# Patient Record
Sex: Female | Born: 1980 | Race: White | Hispanic: No | Marital: Single | Smoking: Former smoker
Health system: Southern US, Community
[De-identification: ages and names within clinical notes are randomized; demographics above are authoritative.]

## PROBLEM LIST (undated history)

## (undated) DIAGNOSIS — Z789 Other specified health status: Secondary | ICD-10-CM

---

## 2007-05-08 ENCOUNTER — Emergency Department: Payer: Self-pay | Admitting: Emergency Medicine

## 2007-05-09 ENCOUNTER — Other Ambulatory Visit: Payer: Self-pay

## 2008-01-03 ENCOUNTER — Emergency Department: Payer: Self-pay | Admitting: Emergency Medicine

## 2010-01-07 IMAGING — CR DG HEEL 2V*R*
1 series · 2 of 2 positions shown · non-contrast
Comparison: none

REASON FOR EXAM: Junped off a truck and now has (R) heel pain
COMMENTS:

PROCEDURE:     DXR - DXR CALCANEOUS - HEEL RIGHT  - January 04, 2008 [DATE]
RESULT:     Images of the RIGHT calcaneus demonstrate a fracture through the
posterior aspect without significant distraction. CT correlation may be
beneficial for treatment planning and further extent of the fracture.

[Series 1: view not recorded · 0.17mm/px · 2 of 2 slices shown]
[im 1/2]
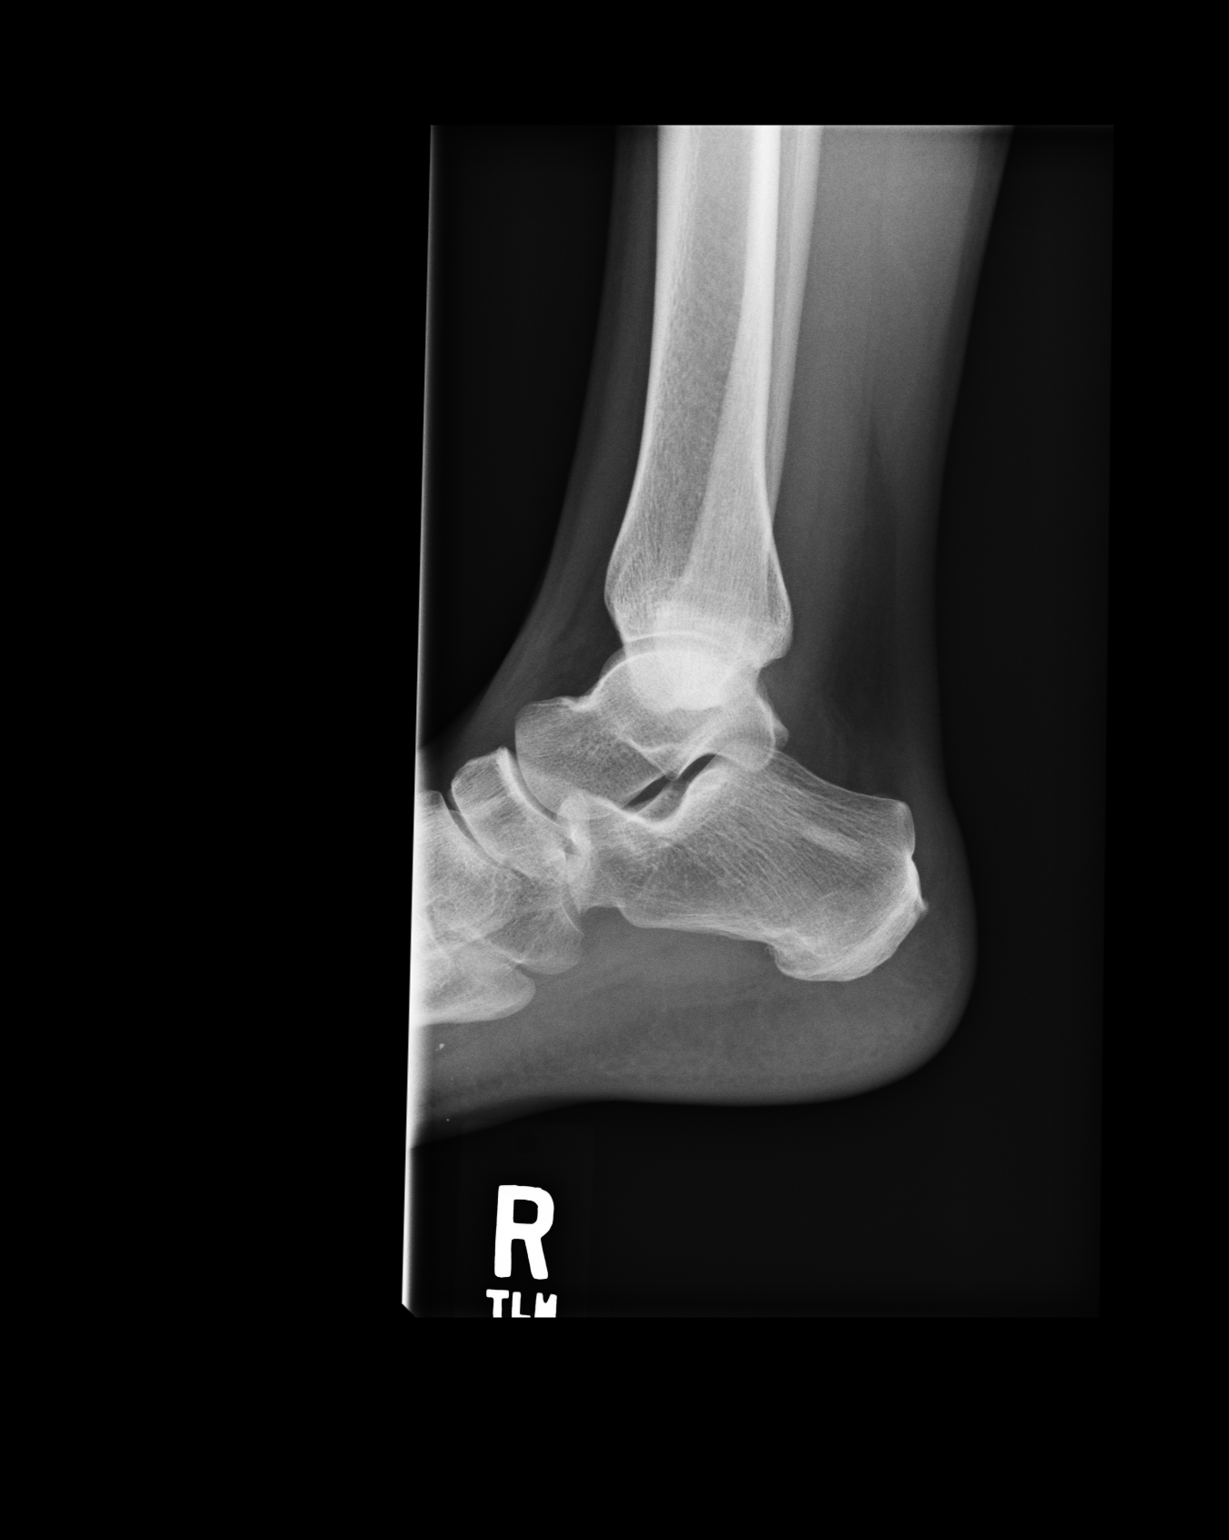
[im 2/2]
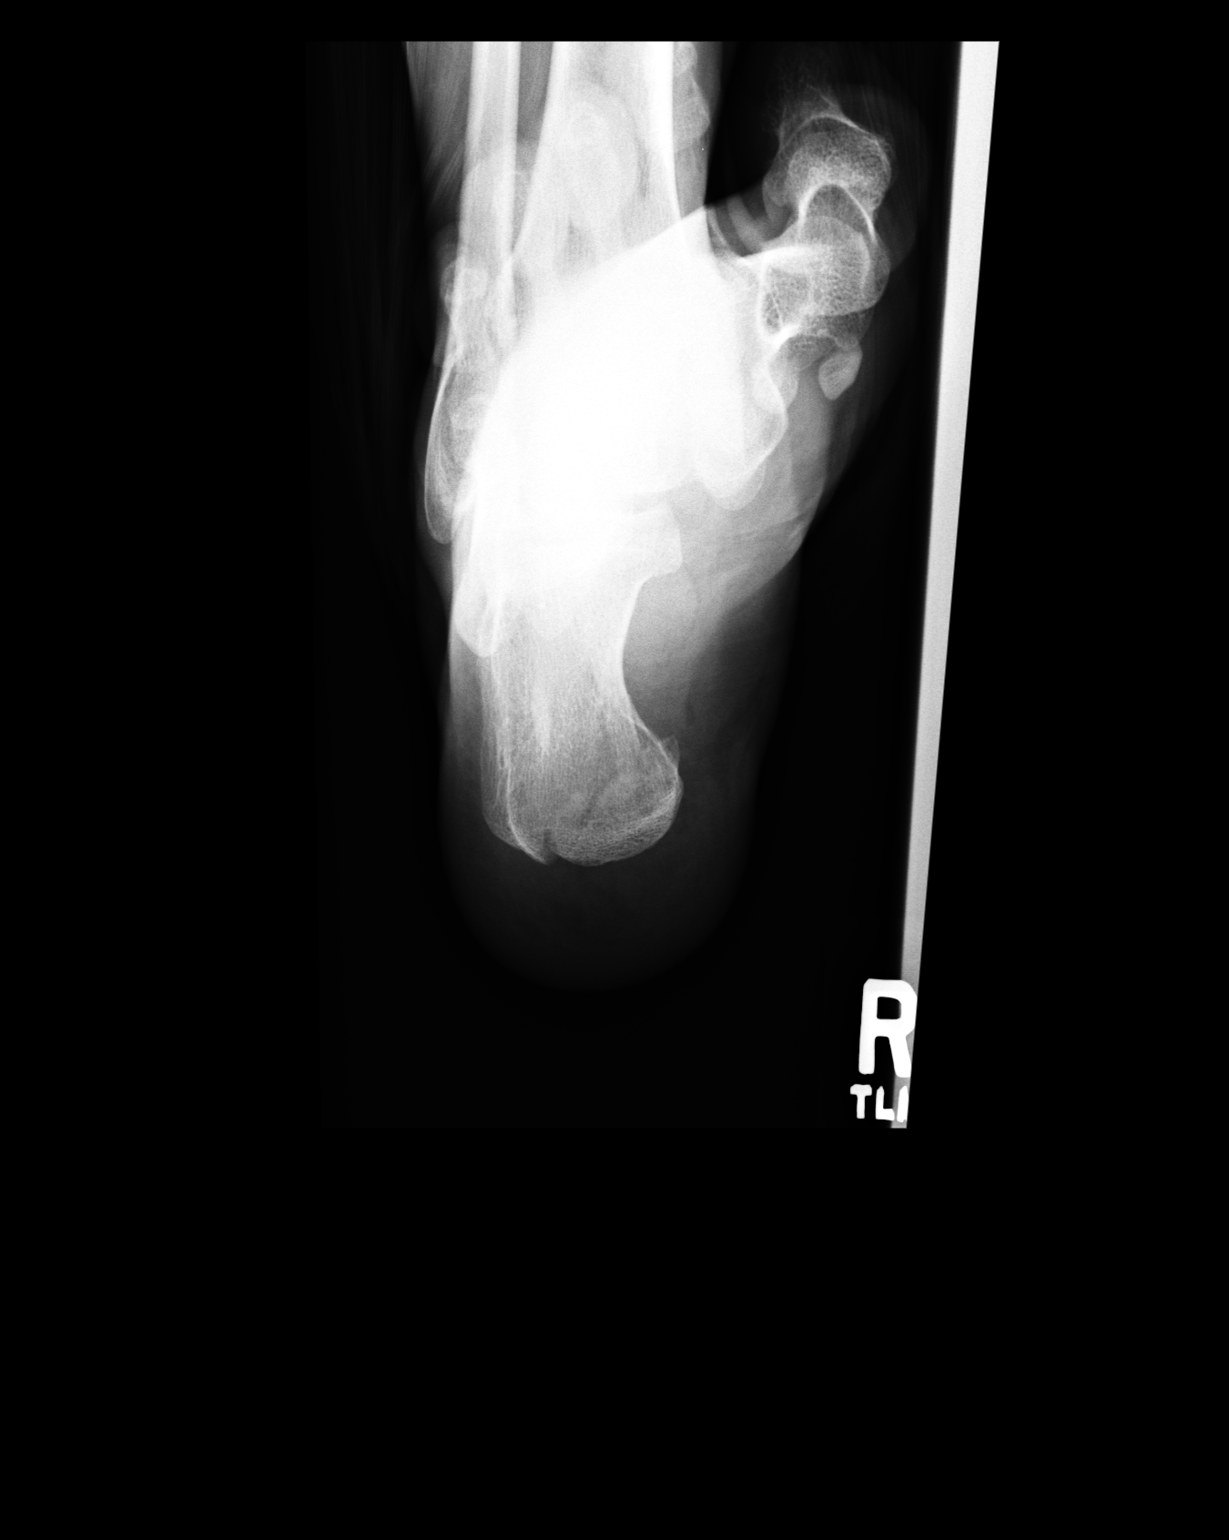

[2 of 2 positions shown; findings below may reference images not displayed]

IMPRESSION: RIGHT calcaneal fracture. Followup CT may be helpful.

## 2012-08-30 LAB — OB RESULTS CONSOLE RPR: RPR: NONREACTIVE

## 2012-08-30 LAB — OB RESULTS CONSOLE RUBELLA ANTIBODY, IGM: Rubella: IMMUNE

## 2012-08-30 LAB — OB RESULTS CONSOLE GC/CHLAMYDIA
Chlamydia: NEGATIVE
Gonorrhea: NEGATIVE

## 2012-08-30 LAB — OB RESULTS CONSOLE ANTIBODY SCREEN: Antibody Screen: POSITIVE

## 2012-09-05 NOTE — L&D Delivery Note (Signed)
Delivery Note At 11:04 AM a viable female was delivered via Vaginal, Vacuum (Extractor) (Presentation: Occiput Anterior).  APGAR: 9, 9; weight pending.   Placenta status: Intact, Spontaneous.  Cord: 3 vessels with the following complications: None.  With the vertex at +3 station, HR down to 90s.  Kiwi vacuum applied and with the vacuum in the green zone vtx delivered with 2 ctx.  No complications with outlet vacuum delivery.    Anesthesia: Epidural  Episiotomy: None Lacerations: 2nd degree;Perineal Suture Repair: 3.0 vicryl Est. Blood Loss (mL): 350  Mom to postpartum.  Baby to nursery and adoptive parents.  Berman Grainger D 02/25/2013, 11:24 AM

## 2013-02-19 ENCOUNTER — Encounter (HOSPITAL_COMMUNITY): Payer: Self-pay | Admitting: *Deleted

## 2013-02-19 ENCOUNTER — Inpatient Hospital Stay (HOSPITAL_COMMUNITY)
Admission: AD | Admit: 2013-02-19 | Discharge: 2013-02-19 | Disposition: A | Discharge status: Home / Self Care | Payer: Medicaid Other | Source: Ambulatory Visit | Attending: Obstetrics and Gynecology | Admitting: Obstetrics and Gynecology

## 2013-02-19 DIAGNOSIS — O479 False labor, unspecified: Secondary | ICD-10-CM | POA: Insufficient documentation

## 2013-02-19 HISTORY — DX: Other specified health status: Z78.9

## 2013-02-19 NOTE — MAU Note (Signed)
Contractions, pressure 

## 2013-02-24 ENCOUNTER — Inpatient Hospital Stay (HOSPITAL_COMMUNITY)
Admission: AD | Admit: 2013-02-24 | Discharge: 2013-02-25 | DRG: 775 | Disposition: A | Discharge status: Home / Self Care | Payer: Medicaid Other | Source: Ambulatory Visit | Attending: Obstetrics and Gynecology | Admitting: Obstetrics and Gynecology

## 2013-02-24 ENCOUNTER — Encounter (HOSPITAL_COMMUNITY): Payer: Self-pay | Admitting: *Deleted

## 2013-02-24 DIAGNOSIS — IMO0001 Reserved for inherently not codable concepts without codable children: Secondary | ICD-10-CM

## 2013-02-24 LAB — POCT FERN TEST: POCT Fern Test: NEGATIVE

## 2013-02-24 MED ORDER — IBUPROFEN 600 MG PO TABS: 600.0000 mg | ORAL_TABLET | Freq: Four times a day (QID) | ORAL | Status: DC | PRN

## 2013-02-24 MED ORDER — ONDANSETRON HCL 4 MG/2ML IJ SOLN: 4.0000 mg | Freq: Four times a day (QID) | INTRAMUSCULAR | Status: DC | PRN

## 2013-02-24 MED ORDER — PHENYLEPHRINE 40 MCG/ML (10ML) SYRINGE FOR IV PUSH (FOR BLOOD PRESSURE SUPPORT): 80.0000 ug | PREFILLED_SYRINGE | INTRAVENOUS | Status: DC | PRN

## 2013-02-24 MED ORDER — OXYTOCIN BOLUS FROM INFUSION: 500.0000 mL | INTRAVENOUS | Status: DC

## 2013-02-24 MED ORDER — LIDOCAINE HCL (PF) 1 % IJ SOLN: 30.0000 mL | INTRAMUSCULAR | Status: DC | PRN

## 2013-02-24 MED ORDER — LACTATED RINGERS IV SOLN: 500.0000 mL | INTRAVENOUS | Status: DC | PRN

## 2013-02-24 MED ORDER — EPHEDRINE 5 MG/ML INJ: 10.0000 mg | INTRAVENOUS | Status: DC | PRN

## 2013-02-24 MED ORDER — DIPHENHYDRAMINE HCL 50 MG/ML IJ SOLN: 12.5000 mg | INTRAMUSCULAR | Status: DC | PRN

## 2013-02-24 MED ORDER — ACETAMINOPHEN 325 MG PO TABS: 650.0000 mg | ORAL_TABLET | ORAL | Status: DC | PRN

## 2013-02-24 MED ORDER — FLEET ENEMA 7-19 GM/118ML RE ENEM: 1.0000 | ENEMA | RECTAL | Status: DC | PRN

## 2013-02-24 MED ORDER — OXYTOCIN 40 UNITS IN LACTATED RINGERS INFUSION - SIMPLE MED: 62.5000 mL/h | INTRAVENOUS | Status: DC

## 2013-02-24 MED ORDER — EPHEDRINE 5 MG/ML INJ
10.0000 mg | INTRAVENOUS | Status: DC | PRN
  Filled 2013-02-24: qty 4

## 2013-02-24 MED ORDER — PHENYLEPHRINE 40 MCG/ML (10ML) SYRINGE FOR IV PUSH (FOR BLOOD PRESSURE SUPPORT)
80.0000 ug | PREFILLED_SYRINGE | INTRAVENOUS | Status: DC | PRN
  Filled 2013-02-24: qty 5

## 2013-02-24 MED ORDER — LACTATED RINGERS IV SOLN
INTRAVENOUS | Status: DC
  Administered 2013-02-25 (×2): via INTRAVENOUS

## 2013-02-24 MED ORDER — CITRIC ACID-SODIUM CITRATE 334-500 MG/5ML PO SOLN: 30.0000 mL | ORAL | Status: DC | PRN

## 2013-02-24 MED ORDER — LACTATED RINGERS IV SOLN: 500.0000 mL | Freq: Once | INTRAVENOUS | Status: DC

## 2013-02-24 MED ORDER — FENTANYL 2.5 MCG/ML BUPIVACAINE 1/10 % EPIDURAL INFUSION (WH - ANES)
14.0000 mL/h | INTRAMUSCULAR | Status: DC | PRN
  Administered 2013-02-25: 14 mL/h via EPIDURAL
  Filled 2013-02-24: qty 125

## 2013-02-24 MED ORDER — OXYCODONE-ACETAMINOPHEN 5-325 MG PO TABS: 1.0000 | ORAL_TABLET | ORAL | Status: DC | PRN

## 2013-02-24 NOTE — MAU Note (Signed)
Pt presents with contractions and bloody discharge

## 2013-02-24 NOTE — MAU Note (Signed)
Pt. Here for contractions for about 3 hours that have been about 5 minutes apart. Pt. Has been experiencing discharge all day. Did not wear a pad in. States discharge is white with a red streak.

## 2013-02-25 ENCOUNTER — Inpatient Hospital Stay (HOSPITAL_COMMUNITY): Payer: Medicaid Other | Admitting: Anesthesiology

## 2013-02-25 ENCOUNTER — Encounter (HOSPITAL_COMMUNITY): Payer: Self-pay | Admitting: *Deleted

## 2013-02-25 ENCOUNTER — Encounter (HOSPITAL_COMMUNITY): Payer: Self-pay | Admitting: Anesthesiology

## 2013-02-25 DIAGNOSIS — IMO0001 Reserved for inherently not codable concepts without codable children: Secondary | ICD-10-CM

## 2013-02-25 LAB — CBC
HCT: 42.6 % (ref 36.0–46.0)
Hemoglobin: 14.9 g/dL (ref 12.0–15.0)
MCH: 31.5 pg (ref 26.0–34.0)
RBC: 4.73 MIL/uL (ref 3.87–5.11)

## 2013-02-25 LAB — RPR: RPR Ser Ql: NONREACTIVE

## 2013-02-25 MED ORDER — MAGNESIUM HYDROXIDE 400 MG/5ML PO SUSP: 30.0000 mL | ORAL | Status: DC | PRN

## 2013-02-25 MED ORDER — BENZOCAINE-MENTHOL 20-0.5 % EX AERO
1.0000 "application " | INHALATION_SPRAY | CUTANEOUS | Status: DC | PRN
  Administered 2013-02-25: 1 via TOPICAL
  Filled 2013-02-25: qty 56

## 2013-02-25 MED ORDER — TERBUTALINE SULFATE 1 MG/ML IJ SOLN: 0.2500 mg | Freq: Once | INTRAMUSCULAR | Status: DC | PRN

## 2013-02-25 MED ORDER — ONDANSETRON HCL 4 MG/2ML IJ SOLN: 4.0000 mg | INTRAMUSCULAR | Status: DC | PRN

## 2013-02-25 MED ORDER — ONDANSETRON HCL 4 MG PO TABS: 4.0000 mg | ORAL_TABLET | ORAL | Status: DC | PRN

## 2013-02-25 MED ORDER — IBUPROFEN 600 MG PO TABS
600.0000 mg | ORAL_TABLET | Freq: Four times a day (QID) | ORAL | Status: DC
  Administered 2013-02-25: 600 mg via ORAL
  Filled 2013-02-25 (×2): qty 1

## 2013-02-25 MED ORDER — PRENATAL MULTIVITAMIN CH: 1.0000 | ORAL_TABLET | Freq: Every day | ORAL | Status: DC

## 2013-02-25 MED ORDER — LANOLIN HYDROUS EX OINT: TOPICAL_OINTMENT | CUTANEOUS | Status: DC | PRN

## 2013-02-25 MED ORDER — LIDOCAINE HCL (PF) 1 % IJ SOLN
INTRAMUSCULAR | Status: DC | PRN
  Administered 2013-02-25 (×2): 5 mL

## 2013-02-25 MED ORDER — OXYCODONE-ACETAMINOPHEN 5-325 MG PO TABS: 1.0000 | ORAL_TABLET | ORAL | Status: DC | PRN

## 2013-02-25 MED ORDER — ZOLPIDEM TARTRATE 5 MG PO TABS: 5.0000 mg | ORAL_TABLET | Freq: Every evening | ORAL | Status: DC | PRN

## 2013-02-25 MED ORDER — DIBUCAINE 1 % RE OINT: 1.0000 "application " | TOPICAL_OINTMENT | RECTAL | Status: DC | PRN

## 2013-02-25 MED ORDER — OXYTOCIN 40 UNITS IN LACTATED RINGERS INFUSION - SIMPLE MED
1.0000 m[IU]/min | INTRAVENOUS | Status: DC
  Administered 2013-02-25: 1 m[IU]/min via INTRAVENOUS
  Filled 2013-02-25: qty 1000

## 2013-02-25 MED ORDER — TETANUS-DIPHTH-ACELL PERTUSSIS 5-2.5-18.5 LF-MCG/0.5 IM SUSP: 0.5000 mL | Freq: Once | INTRAMUSCULAR | Status: DC

## 2013-02-25 MED ORDER — METHYLERGONOVINE MALEATE 0.2 MG/ML IJ SOLN: 0.2000 mg | INTRAMUSCULAR | Status: DC | PRN

## 2013-02-25 MED ORDER — SENNOSIDES-DOCUSATE SODIUM 8.6-50 MG PO TABS: 2.0000 | ORAL_TABLET | Freq: Every day | ORAL | Status: DC

## 2013-02-25 MED ORDER — MEASLES, MUMPS & RUBELLA VAC ~~LOC~~ INJ: 0.5000 mL | INJECTION | Freq: Once | SUBCUTANEOUS | Status: DC

## 2013-02-25 MED ORDER — DIPHENHYDRAMINE HCL 25 MG PO CAPS: 25.0000 mg | ORAL_CAPSULE | Freq: Four times a day (QID) | ORAL | Status: DC | PRN

## 2013-02-25 MED ORDER — WITCH HAZEL-GLYCERIN EX PADS: 1.0000 "application " | MEDICATED_PAD | CUTANEOUS | Status: DC | PRN

## 2013-02-25 MED ORDER — SIMETHICONE 80 MG PO CHEW: 80.0000 mg | CHEWABLE_TABLET | ORAL | Status: DC | PRN

## 2013-02-25 MED ORDER — METHYLERGONOVINE MALEATE 0.2 MG PO TABS
0.2000 mg | ORAL_TABLET | ORAL | Status: DC | PRN
Start: 2013-02-25 — End: 2013-02-26

## 2013-02-25 NOTE — Anesthesia Procedure Notes (Signed)
Epidural Patient location during procedure: OB Start time: 02/25/2013 3:11 AM  Staffing Anesthesiologist: Angus Seller., Harrell Gave. Performed by: anesthesiologist   Preanesthetic Checklist Completed: patient identified, site marked, surgical consent, pre-op evaluation, timeout performed, IV checked, risks and benefits discussed and monitors and equipment checked  Epidural Patient position: sitting Prep: site prepped and draped and DuraPrep Patient monitoring: continuous pulse ox and blood pressure Approach: midline Injection technique: LOR air and LOR saline  Needle:  Needle type: Tuohy  Needle gauge: 17 G Needle length: 9 cm and 9 Needle insertion depth: 5 cm cm Catheter type: closed end flexible Catheter size: 19 Gauge Catheter at skin depth: 10 cm Test dose: negative  Assessment Events: blood not aspirated, injection not painful, no injection resistance, negative IV test and no paresthesia  Additional Notes Patient identified.  Risk benefits discussed including failed block, incomplete pain control, headache, nerve damage, paralysis, blood pressure changes, nausea, vomiting, reactions to medication both toxic or allergic, and postpartum back pain.  Patient expressed understanding and wished to proceed.  All questions were answered.  Sterile technique used throughout procedure and epidural site dressed with sterile barrier dressing. No paresthesia or other complications noted.The patient did not experience any signs of intravascular injection such as tinnitus or metallic taste in mouth nor signs of intrathecal spread such as rapid motor block. Please see nursing notes for vital signs.

## 2013-02-25 NOTE — Discharge Summary (Signed)
Obstetric Discharge Summary Reason for Admission: onset of labor Prenatal Procedures: none Intrapartum Procedures: vacuum Postpartum Procedures: none Complications-Operative and Postpartum: 2nd degree perineal laceration Hemoglobin  Date Value Range Status  02/25/2013 14.9  12.0 - 15.0 g/dL Final     HCT  Date Value Range Status  02/25/2013 42.6  36.0 - 46.0 % Final    Physical Exam:  General: alert Lochia: appropriate Uterine Fundus: firm  Discharge Diagnoses: Term Pregnancy-delivered and baby for adoption  Discharge Information: Date: 02/25/2013 Activity: pelvic rest Diet: routine Medications: Ibuprofen Condition: stable Instructions: refer to practice specific booklet Discharge to: home Follow-up Information   Follow up with Jaydrien Wassenaar D, MD. Schedule an appointment as soon as possible for a visit in 6 weeks.   Contact information:   [address] [place] [address] [phone]       Newborn Data: Live born female  Birth Weight: 6 lb 11.8 oz (3055 g) APGAR: 9, 9  Baby staying with adoptive parents  Caidance Sybert D 02/25/2013, 8:02 PM

## 2013-02-25 NOTE — Anesthesia Preprocedure Evaluation (Signed)

## 2013-02-25 NOTE — Progress Notes (Signed)
Adoptive parents and grandmother holding baby in room with pt.  Pt requesting wheelchair to have time out of bed and out of room.  Pt in wheelchair with RN.  Took pt, husband, and daughter to large window outside of room #314.  Provided extra chairs for family.  RN requested that pt stay on 3rd floor.  After 5 minutes, family left floor with husband pushing pt in wheelchair.

## 2013-02-25 NOTE — Progress Notes (Signed)
CSW met with pt & adoptive parents briefly to facilitate adoption process.  Adoptive parents attorney, Phillips Grout emailed the "Consent To Adoption" paperwork & asked CSW to have the pt sign.  The pt plans to leave the hospital today once paperwork is complete.  The adoptive parents plan to stay in the room with the infant, if/when MOB is discharged.  CSW will complete full assessment with MOB with pt later this afternoon.

## 2013-02-25 NOTE — Progress Notes (Signed)
Patient ID: Carol Kane, female   DOB: 1981/05/13, 32 y.o.   MRN: [id number] Pt was 6 cm at 2:30 AM and received an epidural She was thought to be 7 cm by the RN on 2 susequent checks. Now, she is 8 cm 100% effaced and the vertex is at 0 station. Pitocin was begun 1 hour ago

## 2013-02-25 NOTE — Progress Notes (Signed)
Pt discharged to home.  Pt ambulated to front door.  Discharge teaching complete.  Information provided for heart healthy, low-sodium.

## 2013-02-25 NOTE — Progress Notes (Signed)
Patient ID: Carol Kane, female   DOB: [birth year], 32 y.o.   MRN: [id number] There was a 4 minute decveleration at 6:30 AM that responded to stopping pitocin and position change. The cervix is 9+ cm and the vertex is at 0 station. I had the pt push once but the head did not descend The FHR is normal now.

## 2013-02-25 NOTE — H&P (Signed)
NAMEDEVLIN, MCVEIGH NO.:  [id number]  MEDICAL RECORD NO.:  [id number]  LOCATION:  9164                          FACILITY:  [facility]  PHYSICIAN:  Malachi Pro. Ambrose Mantle, M.D. DATE OF BIRTH:  Jun 24, 1981  DATE OF ADMISSION:  02/24/2013 DATE OF DISCHARGE:                             HISTORY & PHYSICAL   HISTORY OF PRESENT ILLNESS:  This is a 32 year old white female, para 1- 0-0-1, gravida 2, EDC March 02, 2013, by her last period consistent with an ultrasound.  Blood group and type A positive.  Anti-antibody screen positive anti-Lewis A antibody Pap smear negative with negative HPV, positive for trichomoniasis, Rubella immune.  RPR nonreactive.  Urine culture negative.  Hepatitis B surface antigen negative, HIV negative, GC and Chlamydia negative.  Cystic fibrosis screening declined.  First trimester screening declined.  MSAFP declined.  The patient began her prenatal care at 67 and 5/7th weeks gestation.  The one-hour Glucola and the group B strep did not appear to be on file, but the patient reports her group B strep was negative.  One-hour Glucola actually was 113.  The patient has decided to give the baby up for adoption, and adoptive parents have been with the patient through the pregnancy and they will become the parents of the baby.  PAST MEDICAL HISTORY:  No significant medical history.  No surgical history.  ALLERGIES:  No known drug allergies.  No latex allergy.  FAMILY MEDICAL HISTORY:  No family history.  The patient apparently delivered vaginally a 7-pound 4-ounce female infant in 16 in [employer] in [place], [place].  SOCIAL HISTORY:  She never drank alcohol.  Former smoker.  She stated on her prenatal care record that she was single, however, she introduced a main to me as her husband.  The patient began contracting in the evening of February 24, 2013, came to the hospital.  PHYSICAL EXAMINATION:  GU:  The cervix was observed over a period  of time and was thought by the admitting nurse to progress to 4 cm, and she was admitted. VITAL SIGNS:  Blood pressure 140/67, pulse 88, respirations 18. HEART:  Normal size and sounds.  No murmurs. LUNGS:  Clear to auscultation. ABDOMEN:  Soft.  Fundal height appropriate for gestational age.  Cervix 4-5 cm 80%, vertex at a -2, artificial rupture of the membranes produced clear fluid.  ADMITTING IMPRESSION:  Intrauterine pregnancy 39+ weeks, active labor. The patient declines an epidural.  She will be observed carefully for progress in labor.     Malachi Pro. Ambrose Mantle, M.D.     TFH/MEDQ  D:  02/25/2013  T:  02/25/2013  Job:  [id number]

## 2013-02-25 NOTE — Progress Notes (Signed)
Comfortable with epidural Afeb, VSS FHT- Current category I, no decel since the one at about 0630 VE- Small rim/C/0 Will continue to monitor, hopefully start pushing soon.

## 2013-02-25 NOTE — Anesthesia Postprocedure Evaluation (Signed)
  Anesthesia Post-op Note  Patient: Carol Kane  Procedure(s) Performed: * No procedures listed *  Patient Location: Women's Unit  Anesthesia Type:Epidural  Level of Consciousness: awake, alert  and oriented  Airway and Oxygen Therapy: Patient Spontanous Breathing  Post-op Pain: none  Post-op Assessment: Post-op Vital signs reviewed, Patient's Cardiovascular Status Stable, No headache, No backache, No residual numbness and No residual motor weakness  Post-op Vital Signs: Reviewed and stable  Complications: No apparent anesthesia complications

## 2013-02-25 NOTE — Progress Notes (Signed)
Pt is stable, wants to go home Bleeding light, fundus firm

## 2013-02-26 NOTE — Clinical Social Work Maternal (Signed)
    LATE ENTRY FROM 02/25/13:  Clinical Social Work Department PSYCHOSOCIAL ASSESSMENT - MATERNAL/CHILD 02/26/2013  Patient:  Carol Kane, Carol Kane  Account Number:  [id number]  Admit Date:  02/24/2013  Marjo Bicker Name:   [facility]    Clinical Social Worker:  Nobie Putnam, LCSW   Date/Time:  02/25/2013 03:00 PM  Date Referred:  02/25/2013   Referral source  CN     Referred reason  Adoption   Other referral source:    I:  FAMILY / HOME ENVIRONMENT Child's legal guardian:  PARENT  Guardian - Name Guardian - Age Guardian - Address  Solina Heron [address] [place], [place] [address]  Dallie Piles 45    Other household support members/support persons Name Relationship DOB   DAUGHTER 03/23/98   Other support:   Spouse (who is not FOB)    II  PSYCHOSOCIAL DATA Information Source:  Patient Interview  Event organiser Employment:   Financial resources:  Medicaid If Medicaid - County:  [employer]  School / Grade:   Maternity Care Coordinator / Child Services Coordination / Early Interventions:  Cultural issues impacting care:    III  STRENGTHS Strengths  Supportive family/friends   Strength comment:    IV  RISK FACTORS AND CURRENT PROBLEMS Current Problem:  YES   Risk Factor & Current Problem Patient Issue Family Issue Risk Factor / Current Problem Comment  Other - See comment Y N Adoption    V  SOCIAL WORK ASSESSMENT CSW met with pt to assess her current social situation & facilitate adoption plan.  Pt lives alone with her 33 year old daughter.  She is legally married but told CSW that her spouse is not the FOB.  Pt told CSW that she never wanted anymore children, as she is a single parent raising her daughter.  She was connected to the adoptive parents by her sister & started working with them in December.  The adoptive parents accompanied pt to all of her [facility] appointments & described them as "very supportive."  Pt denies any history of  substance use.  CSW informed pt of [place] adoption laws regarding relinquishment & revocation periods.  The FOB & "legal father" have both signed relinquishment paperwork.  The adoptive parents have completed their home study evaluations & are working closely with a SW at DSS.  Pt denies any depression, as she told CSW that she is confident in her decision.  CSW talked about how this plan has affected her daughter & she told CSW that her daughter is also "okay" with her decision. The adoptive parents & their family are present in pt's room visiting with the infant.  Pt is comfortable with the arrangement.  She plans to leave this evening after 5:30, if medically cleared.  The adoptive parents will remain in the room & provide care for the infant.  Financial counselor made aware of plan.  CSW will continue to assist family as needed until discharged.      VI SOCIAL WORK PLAN Social Work Plan  No Further Intervention Required / No Barriers to Discharge   Type of pt/family education:   If child protective services report - county:   If child protective services report - date:   Information/referral to community resources comment:   Other social work plan:

## 2014-07-07 ENCOUNTER — Encounter (HOSPITAL_COMMUNITY): Payer: Self-pay | Admitting: *Deleted

## 2015-11-06 ENCOUNTER — Ambulatory Visit (INDEPENDENT_AMBULATORY_CARE_PROVIDER_SITE_OTHER): Payer: Worker's Compensation | Admitting: Family Medicine

## 2015-11-06 VITALS — BP 116/70 | HR 81 | Temp 98.5°F | Resp 16 | Ht 64.0 in | Wt 143.0 lb

## 2015-11-06 DIAGNOSIS — Z23 Encounter for immunization: Secondary | ICD-10-CM | POA: Diagnosis not present

## 2015-11-06 DIAGNOSIS — S61012A Laceration without foreign body of left thumb without damage to nail, initial encounter: Secondary | ICD-10-CM | POA: Diagnosis not present

## 2015-11-06 NOTE — Progress Notes (Signed)
Patient ID: Carol Kane, female    DOB: [birth year]  Age: 35 y.o. MRN: [id number]  Chief Complaint  Patient presents with  . Hand Injury    left thumb, minor laceration, last night, workers comp    Subjective:   35 year old lady who was using a box cutter at work last night and sliced through her glove into the left thumb. They cleaned it at the workplace and put a wound glue on it. It is done okay except if she is too active with it it bleed some. She does not recall when she last had a tetanus vaccine. She did have a child 3 years ago. She may have gotten one band, but that is not known.  Current allergies, medications, problem list, past/family and social histories reviewed.  Objective:  BP 116/70 mmHg  Pulse 81  Temp(Src) 98.5 F (36.9 C)  Resp 16  Ht 5\' 4"  (1.626 m)  Wt 143 lb (64.864 kg)  BMI 24.53 kg/m2  SpO2 98%  LMP 11/06/2015  No major distress. 2.5 cm laceration left thumb on the pad of the thumb. It is a very superficial wound, just cutting through the callused skin of the thumb. Not inflamed. Not actively oozing or bleeding at this time. It is too superficial to require suturing  Assessment & Plan:   Assessment: 1. Laceration of thumb with delay in treatment, left, initial encounter       Plan: Superficial wound of the thumb that should heal well if kept with a Band-Aid. TDaP    Orders Placed This Encounter  Procedures  . Tdap vaccine greater than or equal to 7yo IM    No orders of the defined types were placed in this encounter.         Patient Instructions  Use some Polysporin ointment with a tight Band-Aid on the finger, changing it when it gets wet. Keep it well protected.  No return visit needed unless it is not fully healed by week from now     Return if symptoms worsen or fail to improve.   Kane,DAVID, MD 11/06/2015

## 2015-11-06 NOTE — Patient Instructions (Signed)
Use some Polysporin ointment with a tight Band-Aid on the finger, changing it when it gets wet. Keep it well protected.  No return visit needed unless it is not fully healed by week from now

## 2021-01-21 ENCOUNTER — Ambulatory Visit: Payer: Self-pay

## 2021-01-26 ENCOUNTER — Encounter: Payer: Self-pay | Admitting: Physician Assistant

## 2021-01-26 ENCOUNTER — Ambulatory Visit: Payer: Self-pay

## 2021-01-26 ENCOUNTER — Ambulatory Visit (LOCAL_COMMUNITY_HEALTH_CENTER): Payer: Self-pay | Admitting: Physician Assistant

## 2021-01-26 ENCOUNTER — Other Ambulatory Visit: Payer: Self-pay

## 2021-01-26 VITALS — BP 136/77 | Ht 64.0 in | Wt 143.8 lb

## 2021-01-26 DIAGNOSIS — Z0000 Encounter for general adult medical examination without abnormal findings: Secondary | ICD-10-CM

## 2021-01-26 DIAGNOSIS — Z3009 Encounter for other general counseling and advice on contraception: Secondary | ICD-10-CM

## 2021-01-26 DIAGNOSIS — Z3046 Encounter for surveillance of implantable subdermal contraceptive: Secondary | ICD-10-CM

## 2021-01-26 MED ORDER — ETONOGESTREL 68 MG ~~LOC~~ IMPL
68.0000 mg | DRUG_IMPLANT | Freq: Once | SUBCUTANEOUS | Status: AC
  Administered 2021-01-26: 68 mg via SUBCUTANEOUS

## 2021-01-28 ENCOUNTER — Encounter: Payer: Self-pay | Admitting: Physician Assistant

## 2021-01-28 NOTE — Progress Notes (Signed)
[facility] DEPARTMENT [facility] [address] Main Number: [phone]    Family Planning Visit- Initial Visit  Subjective:  Carol Kane is a 40 y.o.  [id number]   being seen today for an initial annual visit and to discuss contraceptive options.  The patient is currently using Nexplanon for pregnancy prevention. Patient reports she does not want a pregnancy in the next year.  Patient has the following medical conditions has Active labor at term and Vacuum extractor delivery, delivered on their problem list.  Chief Complaint  Patient presents with  . Contraception    Annual visit with Nexplanon removal/reinsertion.    Patient reports that she is here for her Nexplanon removal and reinsertion. Patient due for CBE due to age 56 and pap due in about 2024.  Patient declines all screenings and blood work today.   Patient denies any concerns.   Body mass index is 24.68 kg/m. - Patient is eligible for diabetes screening based on BMI and age >46?  Patient declines. HA1C ordered? no  Patient reports 0  partner/s in last year. Desires STI screening?  No - patient declines.  Has patient been screened once for HCV in the past?  No  No results found for: HCVAB  Does the patient have current drug use (including MJ), have a partner with drug use, and/or has been incarcerated since last result? No  If yes-- Screen for HCV through [facility]   Does the patient meet criteria for HBV testing? No  Criteria:  -Household, sexual or needle sharing contact with HBV -History of drug use -HIV positive -Those with known Hep C   Health Maintenance Due  Topic Date Due  . COVID-19 Vaccine (1) Never done  . Hepatitis C Screening  Never done  . PAP SMEAR-Modifier  Never done    Review of Systems  All other systems reviewed and are negative.   The following portions of the patient's history were reviewed and updated as appropriate: allergies, current  medications, past family history, past medical history, past social history, past surgical history and problem list. Problem list updated.   See flowsheet for other program required questions.  Objective:   Vitals:   01/26/21 1434  BP: 136/77  Weight: 143 lb 12.8 oz (65.2 kg)  Height: 5\' 4"  (1.626 m)    Physical Exam Vitals and nursing note reviewed.  Constitutional:      General: She is not in acute distress.    Appearance: Normal appearance.  HENT:     Head: Normocephalic and atraumatic.     Mouth/Throat:     Mouth: Mucous membranes are moist.     Pharynx: Oropharynx is clear. No oropharyngeal exudate or posterior oropharyngeal erythema.  Eyes:     Conjunctiva/sclera: Conjunctivae normal.  Neck:     Thyroid: No thyroid mass, thyromegaly or thyroid tenderness.  Cardiovascular:     Rate and Rhythm: Normal rate and regular rhythm.  Pulmonary:     Effort: Pulmonary effort is normal.     Breath sounds: Normal breath sounds.  Abdominal:     Palpations: Abdomen is soft. There is no mass.     Tenderness: There is no abdominal tenderness. There is no guarding or rebound.  Musculoskeletal:     Cervical back: Neck supple. No tenderness.  Lymphadenopathy:     Cervical: No cervical adenopathy.  Skin:    General: Skin is warm and dry.  Neurological:     Mental Status: She  is alert and oriented to person, place, and time.  Psychiatric:        Mood and Affect: Mood normal.        Behavior: Behavior normal.        Thought Content: Thought content normal.        Judgment: Judgment normal.       Assessment and Plan:  Carol Kane is a 40 y.o. female presenting to the [facility] Department for an initial annual wellness/contraceptive visit  Contraception counseling: Reviewed all forms of birth control options in the tiered based approach. available including abstinence; over the counter/barrier methods; hormonal contraceptive medication including pill, patch,  ring, injection,contraceptive implant, ECP; hormonal and nonhormonal IUDs; permanent sterilization options including vasectomy and the various tubal sterilization modalities. Risks, benefits, and typical effectiveness rates were reviewed.  Questions were answered.  Written information was also given to the patient to review.  Patient desires Nexplanon removal and reinsertion, this was prescribed for patient. She will follow up in  1 year and prn for surveillance.  She was told to call with any further questions, or with any concerns about this method of contraception.  Emphasized use of condoms 100% of the time for STI prevention.  Patient was not a candidate for ECP today.   1. Encounter for counseling regarding contraception Reviewed with patient risks, benefits, and SE of Nexplanon and when to call clinic with concerns.  Enc condoms with all sex for STD protection.  2. Well woman exam (no gynecological exam) Reviewed with patient healthy habits to maintain general health. Counseled patient re: guidelines/recommendations for mammograms and pap screening.  Patient declines CBE and pap today.  Enc to RTC for pap and CBE if changes her mind. Enc MVI 1 po daily. Enc to establish with/ follow up with PCP for primary care concerns, age appropriate screenings and illness.  3. Encounter for removal and reinsertion of Nexplanon Nexplanon Removal and Insertion  Patient identified, informed consent performed, consent signed.   Patient does understand that irregular bleeding is a very common side effect of this medication. She was advised to have backup contraception for one week after replacement of the implant. Patient deemed to meet WHO criteria for being reasonably certain she is not pregnant.  Appropriate time out taken. Nexplanon site identified. Area prepped in usual sterile fashon. 2 ml of 1% lidocaine with epinephrine was used to anesthetize the area at the distal end of the implant. A small stab  incision was made right beside the implant on the distal portion. The Nexplanon rod was grasped manually and removed without difficulty. There was minimal blood loss. There were no complications.   Confirmed correct location of insertion site. The insertion site was identified 8-10 cm (3-4 inches) from the medial epicondyle of the humerus and 3-5 cm (1.25-2 inches) posterior to (below) the sulcus (groove) between the biceps and triceps muscles of the patient's left arm. New Nexplanon removed from packaging, Device confirmed in needle, then inserted full length of needle and withdrawn per handbook instructions. Nexplanon was able to palpated in the patient's left arm; patient palpated the insert herself.  There was minimal blood loss. Patient insertion site covered with guaze and a pressure bandage to reduce any bruising. The patient tolerated the procedure well and was given post procedure instructions.   Nexplanon:   Counseled patient to take OTC analgesic starting as soon as lidocaine starts to wear off and take regularly for at least 48 hr to decrease discomfort.  Specifically to take with food or milk to decrease stomach upset and for IB 600 mg (3 tablets) every 6 hrs; IB 800 mg (4 tablets) every 8 hrs; or Aleve 2 tablets every 12 hrs.   - etonogestrel (NEXPLANON) implant 68 mg     Return in about 1 year (around 01/26/2022) for RP and prn.  No future appointments.  Matt Holmes, PA

## 2023-11-20 ENCOUNTER — Ambulatory Visit: Payer: Self-pay | Admitting: Physician Assistant
